# Patient Record
Sex: Female | Born: 1980 | Race: White | Hispanic: No | Marital: Single
Health system: Midwestern US, Community
[De-identification: ages and names within clinical notes are randomized; demographics above are authoritative.]

## PROBLEM LIST (undated history)

## (undated) DIAGNOSIS — M5117 Intervertebral disc disorders with radiculopathy, lumbosacral region: Secondary | ICD-10-CM

## (undated) DIAGNOSIS — F319 Bipolar disorder, unspecified: Secondary | ICD-10-CM

## (undated) HISTORY — PX: PITUITARY EXCISION: SHX745

## (undated) HISTORY — PX: APPENDECTOMY: SHX54

---

## 2014-11-07 ENCOUNTER — Ambulatory Visit: Payer: BLUE CROSS/BLUE SHIELD | Primary: Family Medicine

## 2014-11-13 ENCOUNTER — Inpatient Hospital Stay: Admit: 2014-11-13 | Payer: BLUE CROSS/BLUE SHIELD | Primary: Family Medicine

## 2014-11-13 DIAGNOSIS — Z01818 Encounter for other preprocedural examination: Secondary | ICD-10-CM

## 2014-11-13 LAB — METABOLIC PANEL, BASIC
Anion gap: 9 mmol/L (ref 3.0–18)
BUN/Creatinine ratio: 23 — ABNORMAL HIGH (ref 12–20)
BUN: 15 MG/DL (ref 7.0–18)
CO2: 29 mmol/L (ref 21–32)
Calcium: 9 MG/DL (ref 8.5–10.1)
Chloride: 108 mmol/L (ref 100–108)
Creatinine: 0.64 MG/DL (ref 0.6–1.3)
GFR est AA: >60 mL/min/{1.73_m2} (ref >60)
GFR est non-AA: >60 mL/min/{1.73_m2} (ref >60)
Glucose: 97 mg/dL (ref 74–99)
Potassium: 4.9 mmol/L (ref 3.5–5.5)
Sodium: 146 mmol/L — ABNORMAL HIGH (ref 136–145)

## 2014-11-13 LAB — CBC W/O DIFF
HCT: 39.6 % (ref 35.0–45.0)
HGB: 12.8 g/dL (ref 12.0–16.0)
MCH: 28.7 PG (ref 24.0–34.0)
MCHC: 32.3 g/dL (ref 31.0–37.0)
MCV: 88.8 FL (ref 74.0–97.0)
MPV: 10.2 FL (ref 9.2–11.8)
PLATELET: 420 10*3/uL (ref 135–420)
RBC: 4.46 M/uL (ref 4.20–5.30)
RDW: 16 % — ABNORMAL HIGH (ref 11.6–14.5)
WBC: 9.4 10*3/uL (ref 4.6–13.2)

## 2014-11-13 NOTE — Other (Signed)
Patient denies previous cardiac testing

## 2014-11-13 NOTE — Other (Signed)
Pt denies sleep apnea

## 2014-11-14 LAB — EKG, 12 LEAD, INITIAL
Atrial Rate: 72 {beats}/min
Calculated P Axis: 52 degrees
Calculated R Axis: 27 degrees
Calculated T Axis: 37 degrees
Diagnosis: NORMAL
P-R Interval: 176 ms
Q-T Interval: 394 ms
QRS Duration: 90 ms
QTC Calculation (Bezet): 431 ms
Ventricular Rate: 72 {beats}/min

## 2014-11-14 LAB — MRSA SCREEN - PCR (NASAL)

## 2014-11-20 NOTE — H&P (Signed)
Patient Name:   Diana Durham, [employer]     Date of Birth:  09-Jan-1981      Chief Complaint:  Lower back pain, right-sided L5-S1 disc herniation, and radiculopathy.    History of Chief Complaint:  Diana Durham is a 34 year old female who is being seen for lower back pain, right-sided L5-S1 disc herniation, and radiculopathy. She has had lower back pain and right lower extremity pain and numbness into the ankle and has started to get symptoms in the left lower extremity as well. This has been present for one year and is constant. She does not remember a specific injury. The pain is progressing. She has numbness in the right lower extremity. She has some imbalance and stumbles. Otherwise, the patient has no weakness and no bowel or bladder dysfunction. She finds that sitting and lying down make the pain worse. Walking makes it better. She has had no chiropractic treatment. She had physical therapy in the [place] twice a week for six months with no relief. She had an epidural steroid injection done in [place] one month ago with no relief. She has been using Aleve, methocarbamol, and Flexeril.    Past Medical/Surgical History:    Disease/Disorder Type Date Side Surgery Date Side Comment   Hashimotos disease          Pituitary tumor    Excision of tumor      CSF leak          Asthma          Depression          Thyroid disease              Appendectomy 1992         Cesarean section 1996       Allergies:    Ingredient Reaction Medication Name Comment   CODEINE   CODEINE       Current Medications:    Medication Directions   ibuprofen 600 mg tablet    levothyroxine 75 mcg tablet take 1 tablet by oral route  every day   prazosin 1 mg capsule take 1 capsule by oral route  every day   paroxetine 10 mg tablet take 1 tablet by oral route  every day     Social History:    She is a [employer].  SMOKING  Status Tobacco Type Units Per Day Yrs Used   Never smoker        ALCOHOL   Type: Beer and wine. 1 drink consumed socially.  Last alcoholic drink was two weeks ago.      Family History:    Disease Detail Family Member Age Cause of Death Comments   Family history of Alcoholism       Family history of Cancer       Family history of Diabetes       Family history of High blood pressure         Review of Systems:  A complete review of systems was completed.  Pertinent positives include headache, anxiety, depression, joint pain, joint stiffness, loss of balance, muscle weakness and numbness/tingling.  Pertinent negatives include blurred vision, chest pain, chills, cold, discharge of the eyes, dizziness, double vision, fever, hearing loss, heart murmur, itching of the eyes, palpitations, redness of the eyes, rheumatic fever, ringing in ears, sore throat/hoarseness, weight change, abdominal pain, bipolar disorder, bladder/kidney infection, bloody stool, blood in urine, burning sensation, changes in mood, chronic cough, diarrhea, difficulty breathing, difficulty swallowing, fainting, frequent urinating,  fracture/dislocation, gas/bloating, gout, hemorrhoids, incontinence, memory loss, nausea/vomiting, pain on breathing, painful urination, psoriasis, rash/itching, Raynaud's phenomenon, rheumatoid disease, seizure disorder, shortness of breath, sprain/strain, swelling of feet, tendinitis, varicose veins and wheezing.    Vitals:  Date BP Pulse Temp (F) Resp. (per min.) Height (Total in.) Weight (lbs.) BMI   10/30/2014     61.00 243.00 45.91     Physical Examination:    General:  The patient appears healthy.  Cardiovascular:  Arterial pulses are normal.  Skin:  The skin is normal appearing with no contusions or wounds noted.  Heart- RRR  Lungs-CTA bil  Abdomen- +BS,soft,nontender  Musculoskeletal:  The thoracolumbar spine has normal kyphosis, a normal appearance, and no scoliosis.  There is full range of motion of the cervical, thoracic, and lumbar spine and of the hips, knees, and ankles.   She has a positive straight leg raise on the right.      Neurological:  There is no weakness in the thoracic, lumbar, or sacral spine or in the lower extremities or hips.  Deep tendon reflexes are present and normal bilaterally.  Ankle and knee jerks are normal with no clonus.        Radiograph Examination:  An AP view of the pelvis was obtained and interpreted in the office 9/29/16and reveals no periosteal reaction, no medullary lesions, no osteopenia, well-aligned joint spaces, no chondrolysis, and no fractures.    AP, lateral, bilateral oblique, flexion and extension views of the lumbar spine were obtained and interpreted in the office  and reveal normal alignment, no periosteal reaction, no medullary lesions, no osteopenia, well-aligned disc spaces, no chondrolysis, and no fractures.    Review of her MRI scan done at the [facility] 08/15/14  reveals a large right-sided L5-S1 disc herniation and significant neurologic impingement.    Impression:  Diana Durham and I discussed treatments for her back pain, leg pain, and large disc herniation at L5-S1 including surgical intervention, the risks, and benefits as well as the different surgical approaches and decision making.  We also discussed nonsurgical care for this condition including medications, injections, physical therapy, rehabilitation, activity modification, and brace utilization. At this point, she would like to proceed with operative intervention as she has failed conservative care. We will plan for right-sided L5-S1 microdiscectomy .  The risks versus the benefits as well as the alternatives were fully explained to the patient.  Risks include, but are not limited to, paralysis, death, heart attack, stroke, pulmonary embolism, deep vein thrombosis, infection, failure to relieve pain, increase in back or leg pain, reherniation of disc material, need for revision surgery, scarring, spinal fluid leak, bowel or bladder  dysfunction, disease transmission, chronic graft site pain, instrumentation failure, pseudarthrosis, and the need for chronic ambulatory assist devices.  The patient states full understanding of the risks and benefits and wishes to proceed.

## 2014-11-26 ENCOUNTER — Ambulatory Visit: Admit: 2014-11-26 | Payer: BLUE CROSS/BLUE SHIELD | Primary: Family Medicine

## 2014-11-26 ENCOUNTER — Inpatient Hospital Stay: Payer: BLUE CROSS/BLUE SHIELD

## 2014-11-26 LAB — HCG QL SERUM: HCG, Ql.: NEGATIVE

## 2014-11-26 MED ORDER — NEOSTIGMINE METHYLSULFATE 3 MG/3 ML (1 MG/ML) IV SYRINGE
3 mg/ mL (1 mg/mL) | INTRAVENOUS | Status: AC
Start: 2014-11-26 — End: ?

## 2014-11-26 MED ORDER — KETOROLAC TROMETHAMINE 30 MG/ML INJECTION
30 mg/mL (1 mL) | INTRAMUSCULAR | Status: AC
Start: 2014-11-26 — End: ?

## 2014-11-26 MED ORDER — PROPOFOL 10 MG/ML IV EMUL
10 mg/mL | INTRAVENOUS | Status: AC
Start: 2014-11-26 — End: ?

## 2014-11-26 MED ORDER — GLYCOPYRROLATE 0.2 MG/ML IJ SOLN
0.2 mg/mL | INTRAMUSCULAR | Status: DC | PRN
Start: 2014-11-26 — End: 2014-11-26
  Administered 2014-11-26 (×3): via INTRAVENOUS

## 2014-11-26 MED ORDER — GLUCOSE 4 GRAM CHEWABLE TAB
4 gram | ORAL | Status: DC | PRN
Start: 2014-11-26 — End: 2014-11-26

## 2014-11-26 MED ORDER — SODIUM CHLORIDE 0.9 % IJ SYRG
INTRAMUSCULAR | Status: DC | PRN
Start: 2014-11-26 — End: 2014-11-26

## 2014-11-26 MED ORDER — HYDROMORPHONE (PF) 1 MG/ML IJ SOLN
1 mg/mL | INTRAMUSCULAR | Status: AC
Start: 2014-11-26 — End: 2014-11-26
  Administered 2014-11-26: 20:00:00 via INTRAVENOUS

## 2014-11-26 MED ORDER — ROCURONIUM 10 MG/ML IV
10 mg/mL | INTRAVENOUS | Status: AC
Start: 2014-11-26 — End: ?

## 2014-11-26 MED ORDER — LIDOCAINE (PF) 20 MG/ML (2 %) IJ SOLN
20 mg/mL (2 %) | INTRAMUSCULAR | Status: AC
Start: 2014-11-26 — End: ?

## 2014-11-26 MED ORDER — LIDOCAINE (PF) 20 MG/ML (2 %) IJ SOLN
20 mg/mL (2 %) | INTRAMUSCULAR | Status: DC | PRN
Start: 2014-11-26 — End: 2014-11-26
  Administered 2014-11-26: 18:00:00 via INTRAVENOUS

## 2014-11-26 MED ORDER — METOCLOPRAMIDE 5 MG/ML IJ SOLN
5 mg/mL | INTRAMUSCULAR | Status: DC | PRN
Start: 2014-11-26 — End: 2014-11-26
  Administered 2014-11-26: 18:00:00 via INTRAVENOUS

## 2014-11-26 MED ORDER — KETOROLAC TROMETHAMINE 30 MG/ML INJECTION
30 mg/mL (1 mL) | INTRAMUSCULAR | Status: DC | PRN
Start: 2014-11-26 — End: 2014-11-26
  Administered 2014-11-26: 19:00:00 via INTRAVENOUS

## 2014-11-26 MED ORDER — CEFAZOLIN 2 GM/50 ML IN DEXTROSE (ISO-OSMOTIC) IVPB
2 gram/50 mL | Freq: Once | INTRAVENOUS | Status: AC
Start: 2014-11-26 — End: 2014-11-26
  Administered 2014-11-26: 18:00:00 via INTRAVENOUS

## 2014-11-26 MED ORDER — HYDROMORPHONE (PF) 1 MG/ML IJ SOLN
1 mg/mL | INTRAMUSCULAR | Status: DC | PRN
Start: 2014-11-26 — End: 2014-11-26
  Administered 2014-11-26 (×2): via INTRAVENOUS

## 2014-11-26 MED ORDER — MIDAZOLAM 1 MG/ML IJ SOLN
1 mg/mL | INTRAMUSCULAR | Status: DC | PRN
Start: 2014-11-26 — End: 2014-11-26
  Administered 2014-11-26: 18:00:00 via INTRAVENOUS

## 2014-11-26 MED ORDER — NALOXONE 0.4 MG/ML INJECTION
0.4 mg/mL | INTRAMUSCULAR | Status: DC | PRN
Start: 2014-11-26 — End: 2014-11-26

## 2014-11-26 MED ORDER — SODIUM CHLORIDE 0.9 % INJECTION
INTRAMUSCULAR | Status: AC
Start: 2014-11-26 — End: ?

## 2014-11-26 MED ORDER — LACTATED RINGERS IV
INTRAVENOUS | Status: DC
Start: 2014-11-26 — End: 2014-11-26

## 2014-11-26 MED ORDER — BUPIVACAINE-EPINEPHRINE (PF) 0.25 %-1:200,000 IJ SOLN
0.25 %-1:200,000 | INTRAMUSCULAR | Status: AC
Start: 2014-11-26 — End: ?

## 2014-11-26 MED ORDER — GLUCAGON 1 MG INJECTION
1 mg | INTRAMUSCULAR | Status: DC | PRN
Start: 2014-11-26 — End: 2014-11-26

## 2014-11-26 MED ORDER — HYDROMORPHONE (PF) 1 MG/ML IJ SOLN
1 mg/mL | INTRAMUSCULAR | Status: AC | PRN
Start: 2014-11-26 — End: 2014-11-26
  Administered 2014-11-26: 20:00:00 via INTRAVENOUS

## 2014-11-26 MED ORDER — GLYCOPYRROLATE 0.2 MG/ML IJ SOLN
0.2 mg/mL | INTRAMUSCULAR | Status: AC
Start: 2014-11-26 — End: ?

## 2014-11-26 MED ORDER — BACITRACIN 50,000 UNIT IM
50000 unit | INTRAMUSCULAR | Status: DC | PRN
Start: 2014-11-26 — End: 2014-11-26
  Administered 2014-11-26: 18:00:00

## 2014-11-26 MED ORDER — FENTANYL CITRATE (PF) 50 MCG/ML IJ SOLN
50 mcg/mL | INTRAMUSCULAR | Status: DC | PRN
Start: 2014-11-26 — End: 2014-11-26
  Administered 2014-11-26 (×4): via INTRAVENOUS

## 2014-11-26 MED ORDER — HYDROMORPHONE (PF) 1 MG/ML IJ SOLN
1 mg/mL | INTRAMUSCULAR | Status: AC
Start: 2014-11-26 — End: ?

## 2014-11-26 MED ORDER — BUPIVACAINE-EPINEPHRINE (PF) 0.25 %-1:200,000 IJ SOLN
0.25 %-1:200,000 | INTRAMUSCULAR | Status: DC | PRN
Start: 2014-11-26 — End: 2014-11-26
  Administered 2014-11-26: 19:00:00 via INTRAMUSCULAR

## 2014-11-26 MED ORDER — FENTANYL CITRATE (PF) 50 MCG/ML IJ SOLN
50 mcg/mL | INTRAMUSCULAR | Status: AC
Start: 2014-11-26 — End: ?

## 2014-11-26 MED ORDER — ONDANSETRON (PF) 4 MG/2 ML INJECTION
4 mg/2 mL | INTRAMUSCULAR | Status: DC | PRN
Start: 2014-11-26 — End: 2014-11-26
  Administered 2014-11-26: 19:00:00 via INTRAVENOUS

## 2014-11-26 MED ORDER — DEXTROSE 50% IN WATER (D50W) IV SYRG
INTRAVENOUS | Status: DC | PRN
Start: 2014-11-26 — End: 2014-11-26

## 2014-11-26 MED ORDER — LACTATED RINGERS IV
INTRAVENOUS | Status: DC
Start: 2014-11-26 — End: 2014-11-26
  Administered 2014-11-26 (×3): via INTRAVENOUS

## 2014-11-26 MED ORDER — METOCLOPRAMIDE 5 MG/ML IJ SOLN
5 mg/mL | INTRAMUSCULAR | Status: AC
Start: 2014-11-26 — End: ?

## 2014-11-26 MED ORDER — KETAMINE 50 MG/5 ML (10 MG/ML) IN NS IV SYRINGE
50 mg/5 mL (10 mg/mL) | INTRAVENOUS | Status: AC
Start: 2014-11-26 — End: ?

## 2014-11-26 MED ORDER — PROPOFOL 10 MG/ML IV EMUL
10 mg/mL | INTRAVENOUS | Status: DC | PRN
Start: 2014-11-26 — End: 2014-11-26
  Administered 2014-11-26 (×5): via INTRAVENOUS

## 2014-11-26 MED ORDER — ONDANSETRON (PF) 4 MG/2 ML INJECTION
4 mg/2 mL | INTRAMUSCULAR | Status: AC
Start: 2014-11-26 — End: ?

## 2014-11-26 MED ORDER — FENTANYL CITRATE (PF) 50 MCG/ML IJ SOLN
50 mcg/mL | INTRAMUSCULAR | Status: AC | PRN
Start: 2014-11-26 — End: 2014-11-26
  Administered 2014-11-26 (×4): via INTRAVENOUS

## 2014-11-26 MED ORDER — HYDROMORPHONE (PF) 1 MG/ML IJ SOLN
1 mg/mL | INTRAMUSCULAR | Status: AC | PRN
Start: 2014-11-26 — End: 2014-11-26
  Administered 2014-11-26 (×2): via INTRAVENOUS

## 2014-11-26 MED ORDER — MIDAZOLAM 1 MG/ML IJ SOLN
1 mg/mL | INTRAMUSCULAR | Status: AC
Start: 2014-11-26 — End: ?

## 2014-11-26 MED ORDER — NEOSTIGMINE METHYLSULFATE 5 MG/5 ML (1 MG/ML) IV SYRINGE
5 mg/ mL (1 mg/mL) | INTRAVENOUS | Status: DC | PRN
Start: 2014-11-26 — End: 2014-11-26
  Administered 2014-11-26: 19:00:00 via INTRAVENOUS

## 2014-11-26 MED ORDER — ONDANSETRON (PF) 4 MG/2 ML INJECTION
4 mg/2 mL | Freq: Once | INTRAMUSCULAR | Status: DC
Start: 2014-11-26 — End: 2014-11-26

## 2014-11-26 MED ORDER — HYDROMORPHONE 2 MG TAB
2 mg | ORAL_TABLET | ORAL | 0 refills | Status: AC | PRN
Start: 2014-11-26 — End: ?

## 2014-11-26 MED ORDER — ROCURONIUM 10 MG/ML IV
10 mg/mL | INTRAVENOUS | Status: DC | PRN
Start: 2014-11-26 — End: 2014-11-26
  Administered 2014-11-26 (×2): via INTRAVENOUS

## 2014-11-26 MED ORDER — KETAMINE 10 MG/ML IJ SOLN
10 mg/mL | INTRAMUSCULAR | Status: DC | PRN
Start: 2014-11-26 — End: 2014-11-26
  Administered 2014-11-26 (×3): via INTRAVENOUS

## 2014-11-26 MED ORDER — BACITRACIN 50,000 UNIT IM
50000 unit | INTRAMUSCULAR | Status: AC
Start: 2014-11-26 — End: ?

## 2014-11-26 MED FILL — SENSORCAINE-MPF/EPINEPHRINE 0.25 %-1:200,000 INJECTION SOLUTION: 0.25 %-1:200,000 | INTRAMUSCULAR | Qty: 30

## 2014-11-26 MED FILL — LACTATED RINGERS IV: INTRAVENOUS | Qty: 500

## 2014-11-26 MED FILL — HYDROMORPHONE (PF) 1 MG/ML IJ SOLN: 1 mg/mL | INTRAMUSCULAR | Qty: 1

## 2014-11-26 MED FILL — CEFAZOLIN 2 GM/50 ML IN DEXTROSE (ISO-OSMOTIC) IVPB: 2 gram/50 mL | INTRAVENOUS | Qty: 50

## 2014-11-26 MED FILL — DEX4 GLUCOSE 4 GRAM CHEWABLE TABLET: 4 gram | ORAL | Qty: 4

## 2014-11-26 MED FILL — GLYCOPYRROLATE 0.2 MG/ML IJ SOLN: 0.2 mg/mL | INTRAMUSCULAR | Qty: 3

## 2014-11-26 MED FILL — FENTANYL CITRATE (PF) 50 MCG/ML IJ SOLN: 50 mcg/mL | INTRAMUSCULAR | Qty: 2

## 2014-11-26 MED FILL — ONDANSETRON (PF) 4 MG/2 ML INJECTION: 4 mg/2 mL | INTRAMUSCULAR | Qty: 2

## 2014-11-26 MED FILL — NEOSTIGMINE METHYLSULFATE 3 MG/3 ML (1 MG/ML) IV SYRINGE: 3 mg/ mL (1 mg/mL) | INTRAVENOUS | Qty: 3

## 2014-11-26 MED FILL — XYLOCAINE-MPF 20 MG/ML (2 %) INJECTION SOLUTION: 20 mg/mL (2 %) | INTRAMUSCULAR | Qty: 5

## 2014-11-26 MED FILL — METOCLOPRAMIDE 5 MG/ML IJ SOLN: 5 mg/mL | INTRAMUSCULAR | Qty: 2

## 2014-11-26 MED FILL — FENTANYL CITRATE (PF) 50 MCG/ML IJ SOLN: 50 mcg/mL | INTRAMUSCULAR | Qty: 5

## 2014-11-26 MED FILL — MIDAZOLAM 1 MG/ML IJ SOLN: 1 mg/mL | INTRAMUSCULAR | Qty: 2

## 2014-11-26 MED FILL — PROPOFOL 10 MG/ML IV EMUL: 10 mg/mL | INTRAVENOUS | Qty: 20

## 2014-11-26 MED FILL — KETOROLAC TROMETHAMINE 30 MG/ML INJECTION: 30 mg/mL (1 mL) | INTRAMUSCULAR | Qty: 1

## 2014-11-26 MED FILL — KETAMINE 50 MG/5 ML (10 MG/ML) IN NS IV SYRINGE: 50 mg/5 mL (10 mg/mL) | INTRAVENOUS | Qty: 3

## 2014-11-26 MED FILL — GLYCOPYRROLATE 0.2 MG/ML IJ SOLN: 0.2 mg/mL | INTRAMUSCULAR | Qty: 4

## 2014-11-26 MED FILL — LIDOCAINE (PF) 20 MG/ML (2 %) IJ SOLN: 20 mg/mL (2 %) | INTRAMUSCULAR | Qty: 10

## 2014-11-26 MED FILL — SODIUM CHLORIDE 0.9 % INJECTION: INTRAMUSCULAR | Qty: 10

## 2014-11-26 MED FILL — BD POSIFLUSH NORMAL SALINE 0.9 % INJECTION SYRINGE: INTRAMUSCULAR | Qty: 10

## 2014-11-26 MED FILL — ROCURONIUM 10 MG/ML IV: 10 mg/mL | INTRAVENOUS | Qty: 5

## 2014-11-26 MED FILL — KETAMINE 50 MG/5 ML (10 MG/ML) IN NS IV SYRINGE: 50 mg/5 mL (10 mg/mL) | INTRAVENOUS | Qty: 5

## 2014-11-26 MED FILL — LACTATED RINGERS IV: INTRAVENOUS | Qty: 1000

## 2014-11-26 MED FILL — BACITRACIN 50,000 UNIT IM: 50000 unit | INTRAMUSCULAR | Qty: 50000

## 2014-11-26 NOTE — Op Note (Signed)
Op Notes  by Dixon Boosarlson, Jilian West R, MD at 11/26/14 1720                Author: Dixon Boosarlson, Autumm Hattery R, MD  Service: Orthopedic Surgery  Author Type: Physician       Filed: 11/26/14 1853  Date of Service: 11/26/14 1720  Status: Signed          Editor: Dixon Boosarlson, Ediberto Sens R, MD (Physician)                         Patient: Diana Durham  MRN: [id number]   SSN: [id number]          Date of Birth: 09/02/1980   Age: 34 y.o.   Sex: female         Date of Procedure: 11/26/2014    Preoperative Diagnosis: LUMBOSACRAL HERNIATED NUCLEUS PULPOSIS W/RADICULOPATHY,LUMBAGO,SCIATICA,HASHMOTO'S THYROIDITIS   Postoperative Diagnosis: LUMBOSACRAL HERNIATED NUCLEUS PULPOSIS W/RADICULOPATHY,LUMBAGO,SCIATICA,HASHMOTO'S THYROIDITIS     Procedure: Procedure(s):   RIGHT L5-S1 MICRODISKECTOMY W/C-ARM   Surgeon(s) and Role:      * Dixon BoosJeffrey R Ronita Hargreaves, MD - Primary   Assistant: Luane Schoolonia Yocum PA-C   Anesthesia: General    Estimated Blood Loss: 50cc   Specimens: * No specimens in log *    Findings: HNP    Complications: none         Indications: This is a 34 y.o.  year-old female who presents with significant right lower extremity pain, worsening ability to do activities of daily living. X-rays and MRI  scan revealing disc herniation and foraminal stenosis, and degenerative disk disease. Having having failed conservative care now comes for operative intervention.        DESCRIPTION OF PROCEDURE: After correct identification, the patient was    taken to the operating room, placed supine on the table, general    endotracheal anesthesia induced. The patient was then rotated onto the Wilson frame and prepped with DuraPrep.  2grams of Ancef was given.  Midline incision was made in the lumbar spine, taken down to the spinous processes of L5-S1.  The posterior lamina  of L5-S1 was exposed.  McCulloch retractor was then placed. The wound was then irrigated.    Laminectomy was then performed of the inferior aspect of L5 as well as the superior aspect of S1.  This provided excellent visualization of the dura.  The nerve root was then mobilized medially and held with a nerve root retractor. The disc was noted to  quite large and compressing the nerve root.  Large pieces of disc were removed from around the nerve root. #15 blade was used to make a cruciate incision in the annulus and large pieces of disc material were removed from behind the annulus as well as  from the disc space itself.   Bleeding controlled with bipolar electrocautery.   The wound was then irrigated.    Fascia was then closed using #1 Vicryl suture. Subcutaneous tissue    approximated with 2-0 Vicryl suture. Skin approximated with 3-0 PDS suture and dermabond was appled. Sterile dressing was applied.    The patient tolerated the procedure well and taken to Recovery room in good    condition.

## 2014-11-26 NOTE — Op Note (Signed)
Patient: Diana GitelmanDanielle Durham MRN: [id number]  SSN: [id number]    Date of Birth: 02/07/1980  Age: 34 y.o.  Sex: female      Date of Procedure: 11/26/2014   Preoperative Diagnosis: LUMBOSACRAL HERNIATED NUCLEUS PULPOSIS W/RADICULOPATHY,LUMBAGO,SCIATICA,HASHMOTO'S THYROIDITIS  Postoperative Diagnosis: LUMBOSACRAL HERNIATED NUCLEUS PULPOSIS W/RADICULOPATHY,LUMBAGO,SCIATICA,HASHMOTO'S THYROIDITIS    Procedure: Procedure(s):  RIGHT L5-S1 MICRODISKECTOMY W/C-ARM  Surgeon(s) and Role:     * Dixon BoosJeffrey R Claris Guymon, MD - Primary  Assistant: Luane Schoolonia Yocum PA-C  Anesthesia: General   Estimated Blood Loss: 50cc  Specimens: * No specimens in log *   Findings: HNP   Complications: none      Indications: This is a 34 y.o. year-old female who presents with significant right lower extremity pain, worsening ability to do activities of daily living. X-rays and MRI scan revealing disc herniation and foraminal stenosis, and degenerative disk disease. Having having failed conservative care now comes for operative intervention.      DESCRIPTION OF PROCEDURE: After correct identification, the patient was   taken to the operating room, placed supine on the table, general   endotracheal anesthesia induced. The patient was then rotated onto the Wilson frame and prepped with DuraPrep.  2grams of Ancef was given.  Midline incision was made in the lumbar spine, taken down to the spinous processes of L5-S1.  The posterior lamina of L5-S1 was exposed.  McCulloch retractor was then placed. The wound was then irrigated.   Laminectomy was then performed of the inferior aspect of L5 as well as the superior aspect of S1. This provided excellent visualization of the dura.  The nerve root was then mobilized medially and held with a nerve root retractor. The disc was noted to quite large and compressing the nerve root.  Large pieces of disc were removed from around the nerve root. #15 blade was used to make a cruciate incision in the annulus and large pieces  of disc material were removed from behind the annulus as well as from the disc space itself.   Bleeding controlled with bipolar electrocautery.   The wound was then irrigated.   Fascia was then closed using #1 Vicryl suture. Subcutaneous tissue   approximated with 2-0 Vicryl suture. Skin approximated with 3-0 PDS suture and dermabond was appled. Sterile dressing was applied.   The patient tolerated the procedure well and taken to Recovery room in good   condition.

## 2014-11-26 NOTE — Other (Signed)
Pt. Used restroom in pre-op area.  Patient placed on Bair Paws for a minimum of 30 min in  Preop.

## 2014-11-26 NOTE — Other (Signed)
Handoff Report from Operating Room to PACU   Report received from Jospeh, CRNA and Adelina MingsYuko, RN regarding patient, Diana Durham .   Surgeon(s): Lisette Grinderarlson, MD and Procedure confirmed with allergies and dressings discussed.   Anesthesia type, drugs, patient history, complications, estimated blood loss, vital signs, intake and output, and last pain medication, lines, reversal medications and temperature were reviewed.   PACU monitoring initiated. Non-rebreather mask with 10 liters 02 applied. 02Sat 100%. Patient is drowsy, able to Follow commands. Dressing to back CDI, PIV #20 g  Left arm, infusing without difficulty. See Doc flowsheet for physical assessment details.   Cortni Marian SorrowKunzie, RN]

## 2014-11-26 NOTE — Other (Signed)
Discharge instruction reviewed with patient and mother.  Both verbalized understanding.      PTA med list reviewed with Charlotte CrumbJ Scholz RN    IV removed with catheter tip intact.

## 2014-11-26 NOTE — Anesthesia Post-Procedure Evaluation (Signed)
Post-Anesthesia Evaluation and Assessment    Cardiovascular Function/Vital Signs  Visit Vitals   ??? BP 113/66   ??? Pulse 71   ??? Temp 36.6 ??C (97.8 ??F)   ??? Resp 13   ??? Ht '5\' 2"'  (1.575 m)   ??? Wt 113.5 kg (250 lb 2 oz)   ??? SpO2 96%   ??? BMI 45.75 kg/m2       Patient is status post Procedure(s):  RIGHT L5-S1 MICRODISKECTOMY W/C-ARM.    Nausea/Vomiting: Controlled.    Postoperative hydration reviewed and adequate.    Pain:  Pain Scale 1: Numeric (0 - 10) (11/26/14 1625)  Pain Intensity 1: 4 (11/26/14 1625)   Managed.    Neurological Status:   Neuro (WDL): Exceptions to WDL (11/26/14 1507)   At baseline.    Mental Status and Level of Consciousness: Baseline and appropriate for discharge.    Pulmonary Status:   O2 Device: Room air (11/26/14 1600)   Adequate oxygenation and airway patent.    Complications related to anesthesia: None    Post-anesthesia assessment completed. No concerns.    Patient has met all discharge requirements.    Signed By: Sela Hilding, MD    November 26, 2014

## 2014-11-26 NOTE — Other (Signed)
Pt pain elevated, 8/10, spoke with Dr Ilsa IhaSnyder, received order to give fentanyl 25 mcg every 5 min. prn

## 2014-11-26 NOTE — Other (Signed)
pain 6/10 notified Dr Ilsa IhaSnyder, received order for 0.5 mg dilaudid every 5 min prn for 2 doses.

## 2014-11-26 NOTE — Brief Op Note (Signed)
BRIEF OPERATIVE NOTE    Date of Procedure: 11/26/2014   Preoperative Diagnosis: LUMBOSACRAL HERNIATED NUCLEUS PULPOSIS W/RADICULOPATHY,LUMBAGO,SCIATICA,HASHMOTO'S THYROIDITIS  Postoperative Diagnosis: LUMBOSACRAL HERNIATED NUCLEUS PULPOSIS W/RADICULOPATHY,LUMBAGO,SCIATICA,HASHMOTO'S THYROIDITIS    Procedure: Procedure(s):  RIGHT L5-S1 MICRODISKECTOMY W/C-ARM  Surgeon(s) and Role:     * Dixon BoosJeffrey R Carlson, MD - Primary  Assistant: Luane Schoolonia Chantae Soo PA-C  Anesthesia: General   Estimated Blood Loss: 20cc  Specimens: * No specimens in log *   Findings: right L5-S1 HNP   Complications: none  Implants: * No implants in log *

## 2014-11-26 NOTE — Other (Signed)
Pain elevated 8/10.  Spoke with Dr Katrinka BlazingSmith, received order for 0.5 mg dilaudid every 5 min prn for 2 doses.

## 2014-11-26 NOTE — Anesthesia Pre-Procedure Evaluation (Addendum)
Anesthetic History   No history of anesthetic complications            Review of Systems / Medical History  Patient summary reviewed, nursing notes reviewed and pertinent labs reviewed    Pulmonary  Within defined limits                 Neuro/Psych   Within defined limits           Cardiovascular  Within defined limits                Exercise tolerance: >4 METS     GI/Hepatic/Renal  Within defined limits              Endo/Other      Hypothyroidism: well controlled  Morbid obesity     Other Findings   Comments: Pituitary adenoma resected about 2 years ago         Physical Exam    Airway  Mallampati: II  TM Distance: 4 - 6 cm  Neck ROM: normal range of motion   Mouth opening: Normal     Cardiovascular               Dental  No notable dental hx       Pulmonary                 Abdominal         Other Findings            Anesthetic Plan    ASA: 3  Anesthesia type: general          Induction: Intravenous  Anesthetic plan and risks discussed with: Patient

## 2014-11-26 NOTE — Interval H&P Note (Signed)
H&P Update:  Diana Durham was seen and examined.  History and physical has been reviewed. The patient has been examined. There have been no significant clinical changes since the completion of the originally dated History and Physical.    Signed By: Dixon BoosJeffrey R Arad Burston, MD     November 26, 2014 7:35 AM

## 2016-05-30 ENCOUNTER — Encounter (HOSPITAL_COMMUNITY): Payer: Self-pay

## 2016-05-30 ENCOUNTER — Emergency Department (HOSPITAL_COMMUNITY)
Admission: EM | Admit: 2016-05-30 | Discharge: 2016-05-30 | Disposition: A | Discharge status: Home / Self Care | Payer: Self-pay | Attending: Emergency Medicine | Admitting: Emergency Medicine

## 2016-05-30 DIAGNOSIS — N938 Other specified abnormal uterine and vaginal bleeding: Secondary | ICD-10-CM | POA: Insufficient documentation

## 2016-05-30 HISTORY — DX: Bipolar disorder, unspecified: F31.9

## 2016-05-30 LAB — COMPREHENSIVE METABOLIC PANEL
ALT: 24 U/L (ref 14–54)
AST: 27 U/L (ref 15–41)
Albumin: 3.5 g/dL (ref 3.5–5.0)
Alkaline Phosphatase: 85 U/L (ref 38–126)
Anion gap: 9 (ref 5–15)
BILIRUBIN TOTAL: 0.3 mg/dL (ref 0.3–1.2)
BUN: 13 mg/dL (ref 6–20)
CHLORIDE: 103 mmol/L (ref 101–111)
CO2: 24 mmol/L (ref 22–32)
Calcium: 8.8 mg/dL — ABNORMAL LOW (ref 8.9–10.3)
Creatinine, Ser: 0.73 mg/dL (ref 0.44–1.00)
Glucose, Bld: 112 mg/dL — ABNORMAL HIGH (ref 65–99)
POTASSIUM: 3.9 mmol/L (ref 3.5–5.1)
Sodium: 136 mmol/L (ref 135–145)
TOTAL PROTEIN: 7 g/dL (ref 6.5–8.1)

## 2016-05-30 LAB — CBC
HEMATOCRIT: 35.6 % — AB (ref 36.0–46.0)
Hemoglobin: 11.7 g/dL — ABNORMAL LOW (ref 12.0–15.0)
MCH: 28.3 pg (ref 26.0–34.0)
MCHC: 32.9 g/dL (ref 30.0–36.0)
MCV: 86.2 fL (ref 78.0–100.0)
PLATELETS: 314 10*3/uL (ref 150–400)
RBC: 4.13 MIL/uL (ref 3.87–5.11)
RDW: 16.3 % — ABNORMAL HIGH (ref 11.5–15.5)
WBC: 8.4 10*3/uL (ref 4.0–10.5)

## 2016-05-30 LAB — WET PREP, GENITAL
CLUE CELLS WET PREP: NONE SEEN
Sperm: NONE SEEN
TRICH WET PREP: NONE SEEN
Yeast Wet Prep HPF POC: NONE SEEN

## 2016-05-30 MED ORDER — MEGESTROL ACETATE 40 MG PO TABS
40.0000 mg | ORAL_TABLET | Freq: Once | ORAL | Status: AC
  Administered 2016-05-30: 40 mg via ORAL
  Filled 2016-05-30: qty 1

## 2016-05-30 MED ORDER — MEGESTROL ACETATE 20 MG PO TABS: 40.0000 mg | ORAL_TABLET | Freq: Every day | ORAL | 0 refills | Status: AC

## 2016-05-30 MED ORDER — IBUPROFEN 400 MG PO TABS
600.0000 mg | ORAL_TABLET | Freq: Once | ORAL | Status: AC
  Administered 2016-05-30: 600 mg via ORAL
  Filled 2016-05-30: qty 1

## 2016-05-30 MED ORDER — IBUPROFEN 600 MG PO TABS: 600.0000 mg | ORAL_TABLET | Freq: Four times a day (QID) | ORAL | 0 refills | Status: DC | PRN

## 2016-05-30 NOTE — ED Triage Notes (Signed)
Per Pt, Pt is coming from home with complaints of menstrual bleeding x 1 month. Pt reports increase in menstrual bleeding for the past couple days with clots noted and two-three pads used every hour. Complains of a headache, some lightheadedness, and nausea.

## 2016-05-30 NOTE — Discharge Instructions (Signed)
Please read and follow all provided instructions.  Your diagnoses today include:  1. DUB (dysfunctional uterine bleeding)     Tests performed today include: Vital signs. See below for your results today.   Medications prescribed:  Take as prescribed   Home care instructions:  Follow any educational materials contained in this packet.  Follow-up instructions: Please follow-up with Gynecology for further evaluation of symptoms and treatment   Return instructions:  Please return to the Emergency Department if you do not get better, if you get worse, or new symptoms OR  - Fever (temperature greater than 101.44F)  - Bleeding that does not stop with holding pressure to the area    -Severe pain (please note that you may be more sore the day after your accident)  - Chest Pain  - Difficulty breathing  - Severe nausea or vomiting  - Inability to tolerate food and liquids  - Passing out  - Skin becoming red around your wounds  - Change in mental status (confusion or lethargy)  - New numbness or weakness    Please return if you have any other emergent concerns.  Additional Information:  Your vital signs today were: BP 124/73 (BP Location: Left Arm)    Pulse 88    Temp 98.5 F (36.9 C) (Oral)    Resp 18    Ht  (1.549 m)    Wt 119.7 kg    LMP 05/02/2016    SpO2 95%    BMI 49.88 kg/m  If your blood pressure (BP) was elevated above 135/85 this visit, please have this repeated by your doctor within one month. ---------------

## 2016-05-30 NOTE — ED Provider Notes (Signed)
[facility]-EMERGENCY DEPT Provider Note   CSN: [id number] Arrival date & time: 05/30/16  1837     History   Chief Complaint Chief Complaint  Patient presents with  . Vaginal Bleeding    HPI Dominique Steele is a 36 y.o. female.  HPI  36 y.o. female presents to the Emergency Department today complaining of menstrual bleeding x 1 month. Notes increases menses for the past few days using 2-3 pads every hour. Associated light headedness. Mild nausea. No emesis. Noted bilateral lower abdominal cramping that is intermittent. Rates no pain currently. No hx of same x 5 years ago, but resolved. PT does not take any hormonal therapy. Pt is not sexually active. Pt LMP last month. She is usually regular with cycles every month that last 3-4 days. No fevers. No CP/SOB. No other symptoms noted.    Past Medical History:  Diagnosis Date  . Bipolar 1 disorder (HCC)     There are no active problems to display for this patient.   Past Surgical History:  Procedure Laterality Date  . PITUITARY EXCISION      OB History    No data available       Home Medications    Prior to Admission medications   Not on File    Family History No family history on file.  Social History Social History  Substance Use Topics  . Smoking status: Never Smoker  . Smokeless tobacco: Never Used  . Alcohol use No     Allergies   Codeine   Review of Systems Review of Systems ROS reviewed and all are negative for acute change except as noted in the HPI.  Physical Exam Updated Vital Signs BP 124/73 (BP Location: Left Arm)   Pulse 88   Temp 98.5 F (36.9 C) (Oral)   Resp 18   Ht  (1.549 m)   Wt 119.7 kg   LMP 05/02/2016   SpO2 95%   BMI 49.88 kg/m   Physical Exam  Constitutional: She is oriented to person, place, and time. Vital signs are normal. She appears well-developed and well-nourished.  HENT:  Head: Normocephalic and atraumatic.  Right Ear: Hearing normal.  Left Ear:  Hearing normal.  Eyes: Conjunctivae and EOM are normal. Pupils are equal, round, and reactive to light.  Neck: Normal range of motion.  Cardiovascular: Normal rate, regular rhythm, normal heart sounds and intact distal pulses.   Pulmonary/Chest: Effort normal and breath sounds normal.  Abdominal: Soft. There is no tenderness.  Musculoskeletal: Normal range of motion.  Neurological: She is alert and oriented to person, place, and time.  Skin: Skin is warm and dry.  Psychiatric: She has a normal mood and affect. Her speech is normal and behavior is normal. Thought content normal.  Nursing note and vitals reviewed.  Exam performed by Eston Esters,  exam chaperoned Date: 05/30/2016 Pelvic exam: normal external genitalia without evidence of trauma. VULVA: normal appearing vulva with no masses, tenderness or lesion. VAGINA: normal appearing vagina with normal color and discharge, no lesions. CERVIX: normal appearing cervix without lesions, cervical motion tenderness absent, cervical os closed with out purulent discharge; vaginal discharge - bloody, Wet prep and DNA probe for chlamydia and GC obtained.   ADNEXA: normal adnexa in size, nontender and no masses UTERUS: uterus is normal size, shape, consistency and nontender.     ED Treatments / Results  Labs (all labs ordered are listed, but only abnormal results are displayed) Labs Reviewed  COMPREHENSIVE METABOLIC PANEL -  Abnormal; Notable for the following:       Result Value   Glucose, Bld 112 (*)    Calcium 8.8 (*)    All other components within normal limits  CBC - Abnormal; Notable for the following:    Hemoglobin 11.7 (*)    HCT 35.6 (*)    RDW 16.3 (*)    All other components within normal limits  WET PREP, GENITAL  GC/CHLAMYDIA PROBE AMP ([facility]) NOT AT [facility]    EKG  EKG Interpretation None       Radiology No results found.  Procedures Procedures (including critical care time)  Medications Ordered in  ED Medications - No data to display   Initial Impression / Assessment and Plan / ED Course  I have reviewed the triage vital signs and the nursing notes.  Pertinent labs & imaging results that were available during my care of the patient were reviewed by me and considered in my medical decision making (see chart for details).  Final Clinical Impressions(s) / ED Diagnoses  {I have reviewed and evaluated the relevant laboratory values.   {I have reviewed the relevant previous healthcare records.  {I obtained HPI from historian.   ED Course:  Assessment: Pt is a 36 y.o. female who presents with vaginal bleeding x 1 month. Hx same x 5 years ago. Notes LMP last month. She is normally regular with cycles lasting 3-4 days. Pt is not on hormonal therapy. Pt is not sexually active. No trauma to area. On exam, pt in NAD. Nontoxic/nonseptic appearing. VSS. Afebrile. Lungs CTA. Heart RRR. Abdomen nontender soft. GU Exam unremarkable. Noted bleeding from cervix. No Adnexal. No CMT. Mellody Drown prep unremarkable. GC obtained. Consulted with Pharmacy. Given Rx Megase. Plan is to DC home and follow up to OBGYN. At time of discharge, Patient is in no acute distress. Vital Signs are stable. Patient is able to ambulate. Patient able to tolerate PO.   Disposition/Plan:  DC Home Additional Verbal discharge instructions given and discussed with patient.  Pt Instructed to f/u with GYN in the next week for evaluation and treatment of symptoms. Return precautions given Pt acknowledges and agrees with plan  Supervising Physician Mancel Bale, MD  Final diagnoses:  DUB (dysfunctional uterine bleeding)    New Prescriptions New Prescriptions   No medications on file     Audry Pili, PA-C 05/30/16 2333    Mancel Bale, MD 06/02/16 [phone]

## 2016-05-31 LAB — GC/CHLAMYDIA PROBE AMP (~~LOC~~) NOT AT ARMC
CHLAMYDIA, DNA PROBE: NEGATIVE
Neisseria Gonorrhea: NEGATIVE

## 2016-08-20 ENCOUNTER — Emergency Department (HOSPITAL_COMMUNITY): Payer: Self-pay

## 2016-08-20 ENCOUNTER — Encounter (HOSPITAL_COMMUNITY): Payer: Self-pay | Admitting: Emergency Medicine

## 2016-08-20 ENCOUNTER — Emergency Department (HOSPITAL_COMMUNITY)
Admission: EM | Admit: 2016-08-20 | Discharge: 2016-08-21 | Disposition: A | Discharge status: Home / Self Care | Payer: Self-pay | Attending: Emergency Medicine | Admitting: Emergency Medicine

## 2016-08-20 DIAGNOSIS — Y939 Activity, unspecified: Secondary | ICD-10-CM | POA: Insufficient documentation

## 2016-08-20 DIAGNOSIS — S0990XA Unspecified injury of head, initial encounter: Secondary | ICD-10-CM

## 2016-08-20 DIAGNOSIS — Y999 Unspecified external cause status: Secondary | ICD-10-CM | POA: Insufficient documentation

## 2016-08-20 DIAGNOSIS — Z0441 Encounter for examination and observation following alleged adult rape: Secondary | ICD-10-CM | POA: Insufficient documentation

## 2016-08-20 DIAGNOSIS — S098XXA Other specified injuries of head, initial encounter: Secondary | ICD-10-CM | POA: Insufficient documentation

## 2016-08-20 DIAGNOSIS — Y92008 Other place in unspecified non-institutional (private) residence as the place of occurrence of the external cause: Secondary | ICD-10-CM | POA: Insufficient documentation

## 2016-08-20 DIAGNOSIS — R112 Nausea with vomiting, unspecified: Secondary | ICD-10-CM | POA: Insufficient documentation

## 2016-08-20 MED ORDER — ONDANSETRON 4 MG PO TBDP
ORAL_TABLET | ORAL | Status: AC
  Filled 2016-08-20: qty 1

## 2016-08-20 MED ORDER — IBUPROFEN 800 MG PO TABS
800.0000 mg | ORAL_TABLET | Freq: Once | ORAL | Status: AC
  Administered 2016-08-20: 800 mg via ORAL
  Filled 2016-08-20: qty 1

## 2016-08-20 MED ORDER — ONDANSETRON 4 MG PO TBDP
4.0000 mg | ORAL_TABLET | Freq: Once | ORAL | Status: AC | PRN
Start: 2016-08-20 — End: 2016-08-20
  Administered 2016-08-20: 4 mg via ORAL

## 2016-08-20 NOTE — ED Notes (Signed)
Spoke with Sane nurse. Will be here when she is done with her other assignment.
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
## 2016-08-21 ENCOUNTER — Ambulatory Visit (HOSPITAL_COMMUNITY)
Admission: EM | Admit: 2016-08-21 | Discharge: 2016-08-21 | Disposition: A | Discharge status: Home / Self Care | Payer: No Typology Code available for payment source | Source: Ambulatory Visit | Attending: Emergency Medicine | Admitting: Emergency Medicine

## 2016-08-21 DIAGNOSIS — Z0441 Encounter for examination and observation following alleged adult rape: Secondary | ICD-10-CM | POA: Insufficient documentation

## 2016-08-21 MED ORDER — AZITHROMYCIN 250 MG PO TABS
1000.0000 mg | ORAL_TABLET | Freq: Once | ORAL | Status: AC
  Administered 2016-08-21: 1000 mg via ORAL
  Filled 2016-08-21: qty 4

## 2016-08-21 MED ORDER — ACETAMINOPHEN 325 MG PO TABS
650.0000 mg | ORAL_TABLET | Freq: Once | ORAL | Status: AC
  Administered 2016-08-21: 650 mg via ORAL

## 2016-08-21 MED ORDER — METRONIDAZOLE 500 MG PO TABS
2000.0000 mg | ORAL_TABLET | Freq: Once | ORAL | Status: AC
  Administered 2016-08-21: 2000 mg via ORAL
  Filled 2016-08-21: qty 4

## 2016-08-21 MED ORDER — CEFTRIAXONE SODIUM 250 MG IJ SOLR
250.0000 mg | Freq: Once | INTRAMUSCULAR | Status: AC
  Administered 2016-08-21: 250 mg via INTRAMUSCULAR
  Filled 2016-08-21: qty 250

## 2016-08-21 MED ORDER — LIDOCAINE HCL (PF) 1 % IJ SOLN
0.9000 mL | Freq: Once | INTRAMUSCULAR | Status: AC
  Administered 2016-08-21: 0.9 mL
  Filled 2016-08-21: qty 5
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
## 2016-08-27 NOTE — SANE Note (Signed)
Follow-up Phone Call  Patient gives verbal consent for a FNE/SANE follow-up phone call in 48-72 hours: YES  Patient's telephone number: [phone] Patient gives verbal consent to leave voicemail at the phone number listed above: YES DO NOT CALL between the hours of: call anytime

## 2016-09-08 ENCOUNTER — Encounter: Payer: Self-pay | Admitting: Advanced Practice Midwife

## 2017-01-12 ENCOUNTER — Other Ambulatory Visit: Payer: Self-pay

## 2017-01-12 ENCOUNTER — Emergency Department (HOSPITAL_COMMUNITY)
Admission: EM | Admit: 2017-01-12 | Discharge: 2017-01-13 | Disposition: A | Discharge status: Home / Self Care | Payer: Self-pay | Attending: Emergency Medicine | Admitting: Emergency Medicine

## 2017-01-12 ENCOUNTER — Encounter (HOSPITAL_COMMUNITY): Payer: Self-pay | Admitting: Emergency Medicine

## 2017-01-12 DIAGNOSIS — Z79899 Other long term (current) drug therapy: Secondary | ICD-10-CM | POA: Insufficient documentation

## 2017-01-12 DIAGNOSIS — N939 Abnormal uterine and vaginal bleeding, unspecified: Secondary | ICD-10-CM | POA: Insufficient documentation

## 2017-01-12 DIAGNOSIS — R42 Dizziness and giddiness: Secondary | ICD-10-CM | POA: Insufficient documentation

## 2017-01-12 LAB — CBC
HCT: 27.8 % — ABNORMAL LOW (ref 36.0–46.0)
Hemoglobin: 8.8 g/dL — ABNORMAL LOW (ref 12.0–15.0)
MCH: 27.2 pg (ref 26.0–34.0)
MCHC: 31.7 g/dL (ref 30.0–36.0)
MCV: 85.8 fL (ref 78.0–100.0)
PLATELETS: 422 10*3/uL — AB (ref 150–400)
RBC: 3.24 MIL/uL — ABNORMAL LOW (ref 3.87–5.11)
RDW: 15.3 % (ref 11.5–15.5)
WBC: 10.5 10*3/uL (ref 4.0–10.5)

## 2017-01-12 LAB — I-STAT BETA HCG BLOOD, ED (MC, WL, AP ONLY)
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
## 2017-01-13 ENCOUNTER — Inpatient Hospital Stay (HOSPITAL_COMMUNITY)
Admission: AD | Admit: 2017-01-13 | Discharge: 2017-01-13 | Disposition: A | Discharge status: Home / Self Care | Payer: Self-pay | Source: Ambulatory Visit | Attending: Obstetrics and Gynecology | Admitting: Obstetrics and Gynecology

## 2017-01-13 ENCOUNTER — Inpatient Hospital Stay (HOSPITAL_COMMUNITY): Payer: Self-pay

## 2017-01-13 DIAGNOSIS — N939 Abnormal uterine and vaginal bleeding, unspecified: Secondary | ICD-10-CM

## 2017-01-13 DIAGNOSIS — D500 Iron deficiency anemia secondary to blood loss (chronic): Secondary | ICD-10-CM

## 2017-01-13 DIAGNOSIS — N858 Other specified noninflammatory disorders of uterus: Secondary | ICD-10-CM | POA: Insufficient documentation

## 2017-01-13 DIAGNOSIS — N921 Excessive and frequent menstruation with irregular cycle: Secondary | ICD-10-CM | POA: Insufficient documentation

## 2017-01-13 DIAGNOSIS — N920 Excessive and frequent menstruation with regular cycle: Secondary | ICD-10-CM

## 2017-01-13 DIAGNOSIS — D509 Iron deficiency anemia, unspecified: Secondary | ICD-10-CM | POA: Insufficient documentation

## 2017-01-13 LAB — WET PREP, GENITAL
CLUE CELLS WET PREP: NONE SEEN
Sperm: NONE SEEN
TRICH WET PREP: NONE SEEN
YEAST WET PREP: NONE SEEN

## 2017-01-13 MED ORDER — MEGESTROL ACETATE 40 MG PO TABS: ORAL_TABLET | ORAL | 0 refills | Status: AC

## 2017-01-13 MED ORDER — FERROUS SULFATE 325 (65 FE) MG PO TBEC: 325.0000 mg | DELAYED_RELEASE_TABLET | Freq: Two times a day (BID) | ORAL | 1 refills | Status: AC

## 2017-01-13 NOTE — Discharge Instructions (Signed)

## 2017-01-13 NOTE — Discharge Instructions (Signed)
We recommend that you Follow-up with a GYN as soon as possible-- would recommend to call women's clinic in the morning to make an appt. Return to the ED or women's hospital for new or worsening symptoms.

## 2017-01-13 NOTE — MAU Provider Note (Signed)
Ms. Lindaann SloughDanielle N Batchelder  is a 36 y.o. who presents to [facility]U today [facility] heavy menstraul bleeding. States that she came to [facility] for treatment since [facility] said they could not treat her heavy bleeding and to follow up with gynecology.   BP 124/80 (BP Location: Right Arm)   Pulse 86   Temp 98.3 F (36.8 C) (Oral)   Resp 17   Ht 5\' 1"  (1.549 m)   Wt 127 kg (280 lb)   SpO2 100%   BMI 52.91 kg/m   CONSTITUTIONAL: Well-developed, well-nourished female in no acute distress.  ENT: External right and left ear normal.  EYES: EOM intact, conjunctivae normal.  MUSCULOSKELETAL: Normal range of motion.  CARDIOVASCULAR: Regular heart rate RESPIRATORY: Normal effort NEUROLOGICAL: Alert and oriented to person, place, and time.  SKIN: Skin is warm and dry. No rash noted. Not diaphoretic. No erythema. No pallor. PSYCH: Normal mood and affect. Normal behavior. Normal judgment and thought content.  Results for orders placed or performed during the hospital encounter of 01/12/17 (from the past 24 hour(s))  CBC     Status: Abnormal   Collection Time: 01/12/17 10:07 PM  Result Value Ref Range   WBC 10.5 4.0 - 10.5 K/uL   RBC 3.24 (L) 3.87 - 5.11 MIL/uL   Hemoglobin 8.8 (L) 12.0 - 15.0 g/dL   HCT 86.527.8 (L) 78.436.0 - 69.646.0 %   MCV 85.8 78.0 - 100.0 fL   MCH 27.2 26.0 - 34.0 pg   MCHC 31.7 30.0 - 36.0 g/dL   RDW 29.515.3 28.411.5 - 13.215.5 %   Platelets 422 (H) 150 - 400 K/uL  I-Stat beta hCG blood, ED     Status: None   Collection Time: 01/12/17 10:17 PM  Result Value Ref Range   I-stat hCG, quantitative <5.0 <5 mIU/mL   Comment 3          Wet prep, genital     Status: Abnormal   Collection Time: 01/13/17 12:28 AM  Result Value Ref Range   Yeast Wet Prep HPF POC NONE SEEN NONE SEEN   Trich, Wet Prep NONE SEEN NONE SEEN   Clue Cells Wet Prep HPF POC NONE SEEN NONE SEEN   WBC, Wet Prep HPF POC FEW (A) NONE SEEN   Sperm NONE SEEN    [place] Pelvis Transvanginal Non-ob (tv Only)  Result  Date: 01/13/2017 CLINICAL DATA:  Anemia.  Metromenorrhagia. EXAM: TRANSABDOMINAL AND TRANSVAGINAL ULTRASOUND OF PELVIS TECHNIQUE: Both transabdominal and transvaginal ultrasound examinations of the pelvis were performed. Transabdominal technique was performed for global imaging of the pelvis including uterus, ovaries, adnexal regions, and pelvic cul-de-sac. It was necessary to proceed with endovaginal exam following the transabdominal exam to visualize the endometrium. COMPARISON:  None FINDINGS: Uterus Measurements: 7.5 x 4.0 x 4.3 cm. A few tiny cysts are present at the endomyometrial junction region, nonspecific but can be seen with adenomyosis. Endometrium Thickness: 9.2 mm.  No focal abnormality visualized. Right ovary Measurements: 2.7 x 1.8 x 1.9 cm. Normal appearance/no adnexal mass. Left ovary Measurements: 4.1 x 3.7 x 3.6 cm. Simple unilocular 3.2 cm cyst Other findings No abnormal free fluid. IMPRESSION: Normal ovaries. A few tiny cysts are visible at the endomyometrial junction, raising the question of adenomyosis. Electronically Signed   By: Ellery Plunkaniel R Mitchell M.D.   On: 01/13/2017 03:16   [place] Pelvic Complete With Transvaginal  Result Date: 01/13/2017 CLINICAL DATA:  Anemia.  Metromenorrhagia. EXAM: TRANSABDOMINAL AND TRANSVAGINAL ULTRASOUND OF PELVIS TECHNIQUE:  Both transabdominal and transvaginal ultrasound examinations of the pelvis were performed. Transabdominal technique was performed for global imaging of the pelvis including uterus, ovaries, adnexal regions, and pelvic cul-de-sac. It was necessary to proceed with endovaginal exam following the transabdominal exam to visualize the endometrium. COMPARISON:  None FINDINGS: Uterus Measurements: 7.5 x 4.0 x 4.3 cm. A few tiny cysts are present at the endomyometrial junction region, nonspecific but can be seen with adenomyosis. Endometrium Thickness: 9.2 mm.  No focal abnormality visualized. Right ovary Measurements: 2.7 x 1.8 x 1.9 cm. Normal  appearance/no adnexal mass. Left ovary Measurements: 4.1 x 3.7 x 3.6 cm. Simple unilocular 3.2 cm cyst Other findings No abnormal free fluid. IMPRESSION: Normal ovaries. A few tiny cysts are visible at the endomyometrial junction, raising the question of adenomyosis. Electronically Signed   By: Ellery Plunkaniel R Mitchell M.D.   On: 01/13/2017 03:16   MDM: [facility].  Patient consents to US here. Imaging reviewed  A: Menometrorrhagia Excessive vaginal bleeding Iron deficiency anemia  P: Discharge home ins table condition Rx for iron BID Referral placed for OBGYN evaluation; does not have insurance Handout on OBGYN providers in area also provided Will need further outpatient work-up Rx for Megace to help stop bleeding Return precautions discussed    Pincus Largehelps, Amaurie Schreckengost Y, DO 01/13/2017 1:47 AM
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
## 2017-01-13 NOTE — MAU Note (Signed)
[facility]. States that they told her "they couldn't do anything for her." Wants something to stop bleeding. States she has been having heavy vaginal bleeding since October. Pt does not have an OBGYN. Pt states she has a HA but no abdominal pain.

## 2017-01-16 LAB — GC/CHLAMYDIA PROBE AMP (~~LOC~~) NOT AT ARMC
CHLAMYDIA, DNA PROBE: NEGATIVE
Neisseria Gonorrhea: NEGATIVE
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
